# Patient Record
Sex: Male | Born: 1985 | Race: Black or African American | Hispanic: No | Marital: Married | Smoking: Current every day smoker
Health system: Southern US, Community
[De-identification: ages and names within clinical notes are randomized; demographics above are authoritative.]

## PROBLEM LIST (undated history)

## (undated) DIAGNOSIS — J45909 Unspecified asthma, uncomplicated: Secondary | ICD-10-CM

---

## 2004-08-02 ENCOUNTER — Ambulatory Visit: Payer: Self-pay | Admitting: Internal Medicine

## 2004-12-08 ENCOUNTER — Ambulatory Visit: Payer: Self-pay | Admitting: Internal Medicine

## 2004-12-16 ENCOUNTER — Ambulatory Visit: Payer: Self-pay | Admitting: Internal Medicine

## 2005-11-10 ENCOUNTER — Ambulatory Visit: Payer: Self-pay | Admitting: Internal Medicine

## 2006-06-06 ENCOUNTER — Ambulatory Visit: Payer: Self-pay | Admitting: Internal Medicine

## 2006-11-15 ENCOUNTER — Ambulatory Visit: Payer: Self-pay | Admitting: Internal Medicine

## 2007-04-10 DIAGNOSIS — J45909 Unspecified asthma, uncomplicated: Secondary | ICD-10-CM | POA: Insufficient documentation

## 2007-04-10 DIAGNOSIS — J309 Allergic rhinitis, unspecified: Secondary | ICD-10-CM | POA: Insufficient documentation

## 2008-11-30 ENCOUNTER — Emergency Department (HOSPITAL_COMMUNITY): Admission: EM | Admit: 2008-11-30 | Discharge: 2008-11-30 | Payer: Self-pay | Admitting: Emergency Medicine

## 2009-07-02 ENCOUNTER — Ambulatory Visit: Payer: Self-pay | Admitting: Internal Medicine

## 2009-10-04 ENCOUNTER — Ambulatory Visit: Payer: Self-pay | Admitting: Family Medicine

## 2010-05-18 ENCOUNTER — Ambulatory Visit: Payer: Self-pay | Admitting: Internal Medicine

## 2010-08-01 ENCOUNTER — Telehealth: Payer: Self-pay | Admitting: Internal Medicine

## 2010-09-27 NOTE — Assessment & Plan Note (Signed)
Summary: FLU SHOT // RS  Nurse Visit   Vital Signs:  Patient profile:   25 year old male Temp:     98.4 degrees F  Vitals Entered By: Duard Brady LPN (May 18, 2010 2:33 PM)  Allergies: No Known Drug Allergies  Orders Added: 1)  Admin 1st Vaccine [90471] 2)  Flu Vaccine 59yrs + [16109] Flu Vaccine Consent Questions     Do you have a history of severe allergic reactions to this vaccine? no    Any prior history of allergic reactions to egg and/or gelatin? no    Do you have a sensitivity to the preservative Thimersol? no    Do you have a past history of Guillan-Barre Syndrome? no    Do you currently have an acute febrile illness? no    Have you ever had a severe reaction to latex? no    Vaccine information given and explained to patient? yes    Are you currently pregnant? no    Lot Number:[id number]   Exp Date:02/25/2011   Site Given  Left Deltoid IM .lbflu

## 2010-09-27 NOTE — Progress Notes (Signed)
Summary: Provintil refill to [facility]  Phone Note Call from Patient Call back at [phone]   Caller: Patient Call For: Gordy Savers  MD Summary of Call: VM from pt requesting the refill Proventil go to [facility] Mail order Initial call taken by: Sid Falcon LPN,  August 01, 2010 5:08 PM  Follow-up for Phone Call        attempt to call - VM - LMTCB if questions - rx faxed to [facility] - check with them tomorrow to see if they recv'd refill auth. KIk Follow-up by: Duard Brady LPN,  August 01, 2010 5:31 PM    Prescriptions: PROVENTIL HFA 108 (90 BASE) MCG/ACT AERS (ALBUTEROL SULFATE) two puffs every 6 hours as needed for shortness of breath  #6.7 Not Spec x 2   Entered by:   Duard Brady LPN   Authorized by:   Gordy Savers  MD   Signed by:   Duard Brady LPN on 45/40/9811   Method used:   Faxed to ...       [facility]       [address] [place], [place]  [address]       Ph: [phone]       Fax: [phone]   RxID:   [id number]  refaxed to [facility]

## 2010-09-27 NOTE — Assessment & Plan Note (Signed)
Summary: N/V, FEVER // RS   Vital Signs:  Patient profile:   25 year old male Temp:     99.7 degrees F oral BP sitting:   120 / 88  (left arm) Cuff size:   regular  Vitals Entered By: Sid Falcon LPN (October 04, 2009 3:42 PM) CC: nausea & vomiting, fever X 3 days   History of Present Illness: Acute illness. Patient has history of asthma. Acute onset Friday last week chills, body aches, fever up to 103 Saturday and  nonproductive cough. Had vomiting twice Saturday but none since. No diarrhea. Denies sore throat. Ibuprofen helps aches and headache temporarily.  Hx asthma and using Albuterol as needed.  Allergies: No Known Drug Allergies  Past History:  Past Medical History: Last updated: 04/10/2007 Allergic rhinitis Asthma  Social History: Last updated: 04/10/2007 Current Smoker Drug use-no  Review of Systems      See HPI  Physical Exam  General:  patient is alert nontoxic in appearance Eyes:  No corneal or conjunctival inflammation noted. EOMI. Perrla. Funduscopic exam benign, without hemorrhages, exudates or papilledema. Vision grossly normal. Ears:  External ear exam shows no significant lesions or deformities.  Otoscopic examination reveals clear canals, tympanic membranes are intact bilaterally without bulging, retraction, inflammation or discharge. Hearing is grossly normal bilaterally. Mouth:  Oral mucosa and oropharynx without lesions or exudates.  Teeth in good repair. Neck:  No deformities, masses, or tenderness noted.supple Lungs:  few rhonchi right base possibly greater than left. A few faint wheezes but no retractions. Symmetric breath sounds otherwise Heart:  Normal rate and regular rhythm. S1 and S2 normal without gallop, murmur, click, rub or other extra sounds. Skin:  no rash Cervical Nodes:  No lymphadenopathy noted   Impression & Recommendations:  Problem # 1:  COUGH (ICD-786.2) Prob started as viral process.  With hx asthma and ?of asymmetric  breath sounds will start antibiotics.  CXR if not steadily improving next few days.  Complete Medication List: 1)  Allegra 60 Mg Tabs (Fexofenadine hcl) .... Take 1 tablet by mouth once a day 2)  Triderm 0.1 % Crea (Triamcinolone acetonide) .... Apply 1 a small amount to skin twice a day 3)  Proventil Hfa 108 (90 Base) Mcg/act Aers (Albuterol sulfate) .... Two puffs every 6 hours as needed for shortness of breath 4)  Prednisone 10 Mg Tabs (Prednisone) .... Two twice daily for 3 days, then one twice daily for 3 days, then one daily 5)  Azithromycin 250 Mg Tabs (Azithromycin) .... 2 by mouth today then one by mouth once daily for 4 days Prescriptions: AZITHROMYCIN 250 MG TABS (AZITHROMYCIN) 2 by mouth today then one by mouth once daily for 4 days  #6 x 0   Entered and Authorized by:   Evelena Peat MD   Signed by:   Evelena Peat MD on 10/04/2009   Method used:   [id number]       [address]       [place], [place]  [address]       Ph: [phone] or [phone]       Fax: [phone]   RxID:   [id number]

## 2011-03-20 ENCOUNTER — Ambulatory Visit (INDEPENDENT_AMBULATORY_CARE_PROVIDER_SITE_OTHER): Payer: Self-pay | Admitting: Internal Medicine

## 2011-03-20 ENCOUNTER — Encounter: Payer: Self-pay | Admitting: Internal Medicine

## 2011-03-20 VITALS — BP 100/60 | Temp 98.8°F | Wt 162.0 lb

## 2011-03-20 DIAGNOSIS — J069 Acute upper respiratory infection, unspecified: Secondary | ICD-10-CM

## 2011-03-20 DIAGNOSIS — D573 Sickle-cell trait: Secondary | ICD-10-CM

## 2011-03-20 DIAGNOSIS — J029 Acute pharyngitis, unspecified: Secondary | ICD-10-CM

## 2011-03-20 MED ORDER — METHYLPREDNISOLONE ACETATE 80 MG/ML IJ SUSP
80.0000 mg | Freq: Once | INTRAMUSCULAR | Status: AC
  Administered 2011-03-20: 80 mg via INTRAMUSCULAR

## 2011-03-20 MED ORDER — HYDROCODONE-ACETAMINOPHEN 5-500 MG PO TABS: 2.0000 | ORAL_TABLET | Freq: Four times a day (QID) | ORAL | Status: AC | PRN

## 2011-03-20 NOTE — Patient Instructions (Signed)
Vimovo 1 twice daily  Pain medication as directed  Call or return to clinic prn if these symptoms worsen or fail to improve as anticipated.

## 2011-03-20 NOTE — Progress Notes (Signed)
  Subjective:    Patient ID: [name], male    DOB: 1985-12-16, 25 y.o.   MRN: [id number]  HPI  25 year old patient who is seen today in followup. He has a history of asthma and sickle cell trait. He has been ill for the past 10 days and 7 days ago was evaluated at a [facility] ER. He presented with laryngitis sore throat and arthralgias. He was here with IV fluids steroid therapy and has completed 1 week of amoxicillin. He is also given oxycodone for pain.. Initially had improvement of his symptoms but over the weekend has developed more sore throat and fever. His chief complaint is sore throat. He was tested for influenza A and group A hemolytic strep in the ED which was negative. He feels unwell with poor appetite there has been no chills or any sputum production. He denies any wheezing    Review of Systems  Constitutional: Positive for appetite change. Negative for fever, chills and fatigue.  HENT: Negative for hearing loss, ear pain, congestion, sore throat, trouble swallowing, neck stiffness, dental problem, voice change and tinnitus.   Eyes: Negative for pain, discharge and visual disturbance.  Respiratory: Negative for cough, chest tightness, wheezing and stridor.   Cardiovascular: Negative for chest pain, palpitations and leg swelling.  Gastrointestinal: Negative for nausea, vomiting, abdominal pain, diarrhea, constipation, blood in stool and abdominal distention.  Genitourinary: Negative for urgency, hematuria, flank pain, discharge, difficulty urinating and genital sores.  Musculoskeletal: Positive for arthralgias. Negative for myalgias, back pain, joint swelling and gait problem.  Skin: Negative for rash.  Neurological: Negative for dizziness, syncope, speech difficulty, weakness, numbness and headaches.  Hematological: Negative for adenopathy. Does not bruise/bleed easily.  Psychiatric/Behavioral: Negative for behavioral problems and dysphoric mood. The patient is not  nervous/anxious.        Objective:   Physical Exam  Constitutional: He is oriented to person, place, and time. He appears well-developed.  HENT:  Head: Normocephalic.  Right Ear: External ear normal.  Left Ear: External ear normal.  Mouth/Throat: Oropharynx is clear and moist.       Oropharynx normal  Eyes: Conjunctivae and EOM are normal.  Neck: Normal range of motion.  Cardiovascular: Normal rate and normal heart sounds.   Pulmonary/Chest: Effort normal and breath sounds normal. No respiratory distress. He has no wheezes. He has no rales.  Abdominal: Bowel sounds are normal.  Musculoskeletal: Normal range of motion. He exhibits no edema and no tenderness.  Lymphadenopathy:    He has no cervical adenopathy.  Neurological: He is alert and oriented to person, place, and time.  Psychiatric: He has a normal mood and affect. His behavior is normal.          Assessment & Plan:   Viral URI with pharyngitis Arthralgias possible secondary to sickle cell trait  We'll treat symptomatically. Samples of Vimovo dispensed. A prescription for Vicodin also dispensed

## 2011-03-20 NOTE — Progress Notes (Signed)
Addended by: Duard Brady I on: 03/20/2011 10:31 AM   Modules accepted: Orders

## 2011-03-29 ENCOUNTER — Ambulatory Visit: Payer: Self-pay | Admitting: Internal Medicine

## 2011-03-31 ENCOUNTER — Inpatient Hospital Stay (HOSPITAL_COMMUNITY)
Admission: AD | Admit: 2011-03-31 | Discharge: 2011-04-05 | DRG: 096 | Disposition: A | Discharge status: Home / Self Care | Payer: Self-pay | Source: Ambulatory Visit | Attending: Neurology | Admitting: Neurology

## 2011-03-31 ENCOUNTER — Inpatient Hospital Stay (HOSPITAL_COMMUNITY): Payer: Self-pay

## 2011-03-31 ENCOUNTER — Ambulatory Visit (INDEPENDENT_AMBULATORY_CARE_PROVIDER_SITE_OTHER): Payer: Self-pay | Admitting: Family Medicine

## 2011-03-31 ENCOUNTER — Encounter: Payer: Self-pay | Admitting: Family Medicine

## 2011-03-31 VITALS — BP 100/80 | Temp 98.3°F | Wt 151.0 lb

## 2011-03-31 DIAGNOSIS — R29898 Other symptoms and signs involving the musculoskeletal system: Secondary | ICD-10-CM

## 2011-03-31 DIAGNOSIS — M6281 Muscle weakness (generalized): Secondary | ICD-10-CM

## 2011-03-31 DIAGNOSIS — K5900 Constipation, unspecified: Secondary | ICD-10-CM | POA: Diagnosis not present

## 2011-03-31 DIAGNOSIS — G610 Guillain-Barre syndrome: Principal | ICD-10-CM | POA: Diagnosis present

## 2011-03-31 DIAGNOSIS — F1210 Cannabis abuse, uncomplicated: Secondary | ICD-10-CM | POA: Diagnosis present

## 2011-03-31 DIAGNOSIS — R000 Tachycardia, unspecified: Secondary | ICD-10-CM | POA: Diagnosis not present

## 2011-03-31 DIAGNOSIS — R202 Paresthesia of skin: Secondary | ICD-10-CM

## 2011-03-31 DIAGNOSIS — R209 Unspecified disturbances of skin sensation: Secondary | ICD-10-CM

## 2011-03-31 DIAGNOSIS — R1310 Dysphagia, unspecified: Secondary | ICD-10-CM | POA: Diagnosis present

## 2011-03-31 DIAGNOSIS — R292 Abnormal reflex: Secondary | ICD-10-CM

## 2011-03-31 DIAGNOSIS — F17200 Nicotine dependence, unspecified, uncomplicated: Secondary | ICD-10-CM | POA: Diagnosis present

## 2011-03-31 LAB — DIFFERENTIAL
Basophils Absolute: 0.1 10*3/uL (ref 0.0–0.1)
Basophils Relative: 2 % — ABNORMAL HIGH (ref 0–1)
Eosinophils Relative: 0 % (ref 0–5)
Monocytes Absolute: 0.5 10*3/uL (ref 0.1–1.0)
Neutro Abs: 1.4 10*3/uL — ABNORMAL LOW (ref 1.7–7.7)

## 2011-03-31 LAB — CBC
HCT: 40.2 % (ref 39.0–52.0)
MCV: 85 fL (ref 78.0–100.0)
RBC: 4.73 MIL/uL (ref 4.22–5.81)
WBC: 5.5 10*3/uL (ref 4.0–10.5)

## 2011-03-31 LAB — COMPREHENSIVE METABOLIC PANEL
ALT: 158 U/L — ABNORMAL HIGH (ref 0–53)
AST: 84 U/L — ABNORMAL HIGH (ref 0–37)
CO2: 26 mEq/L (ref 19–32)
Chloride: 98 mEq/L (ref 96–112)
GFR calc non Af Amer: >60 mL/min (ref >60)
Potassium: 3.9 mEq/L (ref 3.5–5.1)
Sodium: 132 mEq/L — ABNORMAL LOW (ref 135–145)
Total Bilirubin: 0.5 mg/dL (ref 0.3–1.2)

## 2011-03-31 LAB — APTT: aPTT: 34 seconds (ref 24–37)

## 2011-03-31 MED ORDER — GADOBENATE DIMEGLUMINE 529 MG/ML IV SOLN
15.0000 mL | Freq: Once | INTRAVENOUS | Status: AC | PRN
  Administered 2011-03-31: 15 mL via INTRAVENOUS

## 2011-03-31 NOTE — Progress Notes (Signed)
Subjective:    Patient ID: [name], male    DOB: November 09, 1985, 25 y.o.   MRN: [id number]  HPI Patient seen with progressive dysesthesias and weakness predominantly lower extremities over the past 5-6 days. Recent history is that he developed upper respiratory symptoms about 2 weeks ago with laryngitis and sore throat. Went to emergency room in [place] and was treated with IV steroids and amoxicillin and IV fluids. Felt somewhat better and was seen here several days later and given another shot of Depo-Medrol and again felt somewhat better for about 24 hours. Last Friday he noticed tingling sensation involving much of his body and by Sunday -5 days ago noted onset of weakness which has progressed over the week. He cannot stand without support at this time and is unable to ambulate.  Denies low back pain. No urine or stool incontinence  He has some dyspnea with activity such as transfers. Poor appetite. 11 pound weight loss since 10 days ago. Patient quit smoking a few weeks ago and pt thought symptoms related to nicotine withdrawal. He denies any fever, chills, diarrhea, vomiting, abdominal pain. Occasional diffuse mild headache.    Only chronic problem is mild intermittent asthma. Takes albuterol for that.  Patient relates L knee injured yesterday when police came to his house. He states that he called some friends to assist him at his apartment because he was unable to walk.  His friends had to jump a fence to get into his place and someone reported a possible breaking and entering.  Pt states his friends were held at gunpoint and he (pt) drug himself into their presence to notify police they were his friends.  At that point, patient states he was handcuffed and stood up and subsequently fell on his L knee (as he was unable to stand).  Abrasion to L knee but no other injury reported.   Review of Systems  Constitutional: Positive for activity change, appetite change, fatigue and unexpected  weight change. Negative for fever and chills.  HENT: Negative for ear pain, sore throat, trouble swallowing, neck pain and neck stiffness.   Respiratory: Positive for shortness of breath. Negative for cough and wheezing.   Cardiovascular: Negative for chest pain, palpitations and leg swelling.  Gastrointestinal: Negative for vomiting, abdominal pain, diarrhea and blood in stool.  Genitourinary: Negative for dysuria and hematuria.  Musculoskeletal: Negative for myalgias, back pain and joint swelling.  Skin: Negative for rash.  Neurological: Positive for weakness, numbness and headaches. Negative for dizziness, tremors, seizures and syncope.  Hematological: Negative for adenopathy. Does not bruise/bleed easily.       Objective:   Physical Exam  Constitutional: He is oriented to person, place, and time. He appears well-developed and well-nourished.  HENT:  Head: Normocephalic and atraumatic.  Right Ear: External ear normal.  Left Ear: External ear normal.  Mouth/Throat: Oropharynx is clear and moist.  Eyes: Pupils are equal, round, and reactive to light.  Neck: Neck supple. No thyromegaly present.  Cardiovascular: Exam reveals no gallop.   No murmur heard.      Regular rhythm but slightly tachycardic with rate around 100  Pulmonary/Chest: Effort normal and breath sounds normal. No respiratory distress. He has no wheezes. He has no rales.  Abdominal: Soft. Bowel sounds are normal. He exhibits no distension and no mass. There is no tenderness. There is no rebound and no guarding.  Musculoskeletal: He exhibits no edema.       L knee no effusion.  Small superficial  abrasion.  No ecchymosis and nontender with full ROM.  Lymphadenopathy:    He has no cervical adenopathy.  Neurological: He is alert and oriented to person, place, and time.       Patient is basically areflexic at the knee and trace reflex ankle. 1+ upper extremity reflexes. He has significant weakness involving the lower  extremities with plantar flexion, dorsiflexion, knee extension, and hip flexion bilaterally  Skin: No rash noted.  Psychiatric: He has a normal mood and affect. His behavior is normal.          Assessment & Plan:  Progressive lower extremity weakness and dysesthesias following recent respiratory illness. Symptoms have progressed over the week. Concern for possible Guillain-Barr syndrome. Patient unable to care for himself at this time. Hospital admission to further evaluate Small abrasion L knee related to injury above.

## 2011-04-01 ENCOUNTER — Inpatient Hospital Stay (HOSPITAL_COMMUNITY): Payer: Self-pay

## 2011-04-01 LAB — BASIC METABOLIC PANEL
BUN: 13 mg/dL (ref 6–23)
CO2: 27 mEq/L (ref 19–32)
Calcium: 9.5 mg/dL (ref 8.4–10.5)
Creatinine, Ser: 0.68 mg/dL (ref 0.50–1.35)
GFR calc non Af Amer: >60 mL/min (ref >60)
Glucose, Bld: 110 mg/dL — ABNORMAL HIGH (ref 70–99)

## 2011-04-01 LAB — CSF CELL COUNT WITH DIFFERENTIAL
RBC Count, CSF: 21 /mm3 — ABNORMAL HIGH
RBC Count, CSF: 49 /mm3 — ABNORMAL HIGH
Tube #: 1
Tube #: 4

## 2011-04-01 LAB — URINALYSIS, ROUTINE W REFLEX MICROSCOPIC
Glucose, UA: NEGATIVE mg/dL
Hgb urine dipstick: NEGATIVE
Leukocytes, UA: NEGATIVE
Specific Gravity, Urine: 1.021 (ref 1.005–1.030)
Urobilinogen, UA: 1 mg/dL (ref 0.0–1.0)

## 2011-04-01 LAB — RAPID URINE DRUG SCREEN, HOSP PERFORMED
Amphetamines: NOT DETECTED
Barbiturates: NOT DETECTED
Tetrahydrocannabinol: POSITIVE — AB

## 2011-04-01 LAB — PROTEIN AND GLUCOSE, CSF
Glucose, CSF: 66 mg/dL (ref 43–76)
Total  Protein, CSF: 49 mg/dL — ABNORMAL HIGH (ref 15–45)

## 2011-04-01 LAB — CBC
HCT: 38.5 % — ABNORMAL LOW (ref 39.0–52.0)
Hemoglobin: 14 g/dL (ref 13.0–17.0)
MCH: 31.2 pg (ref 26.0–34.0)
MCHC: 36.4 g/dL — ABNORMAL HIGH (ref 30.0–36.0)
MCV: 85.7 fL (ref 78.0–100.0)
RBC: 4.49 MIL/uL (ref 4.22–5.81)

## 2011-04-01 LAB — HIV ANTIBODY (ROUTINE TESTING W REFLEX): HIV: NONREACTIVE

## 2011-04-02 LAB — BASIC METABOLIC PANEL
BUN: 13 mg/dL (ref 6–23)
CO2: 27 mEq/L (ref 19–32)
Chloride: 99 mEq/L (ref 96–112)
GFR calc Af Amer: >60 mL/min (ref >60)
Glucose, Bld: 91 mg/dL (ref 70–99)
Potassium: 4.2 mEq/L (ref 3.5–5.1)

## 2011-04-02 LAB — CBC
HCT: 40.8 % (ref 39.0–52.0)
MCV: 86.4 fL (ref 78.0–100.0)
RBC: 4.72 MIL/uL (ref 4.22–5.81)
RDW: 12.3 % (ref 11.5–15.5)
WBC: 5.5 10*3/uL (ref 4.0–10.5)

## 2011-04-03 ENCOUNTER — Inpatient Hospital Stay (HOSPITAL_COMMUNITY): Payer: Self-pay

## 2011-04-03 DIAGNOSIS — G610 Guillain-Barre syndrome: Secondary | ICD-10-CM

## 2011-04-04 LAB — BASIC METABOLIC PANEL
BUN: 15 mg/dL (ref 6–23)
CO2: 28 mEq/L (ref 19–32)
Chloride: 98 mEq/L (ref 96–112)
Creatinine, Ser: 0.66 mg/dL (ref 0.50–1.35)
GFR calc Af Amer: >60 mL/min (ref >60)
Glucose, Bld: 97 mg/dL (ref 70–99)

## 2011-04-04 LAB — CSF CULTURE W GRAM STAIN

## 2011-04-04 NOTE — H&P (Signed)
NAMEMYAN, SUIT NO.:  [id number]  MEDICAL RECORD NO.:  [id number]  LOCATION:  3312                         FACILITY:  [facility]  PHYSICIAN:  Tarry Kos, MD       DATE OF BIRTH:  [birth year]  DATE OF ADMISSION:  03/31/2011 DATE OF DISCHARGE:                             HISTORY & PHYSICAL   CHIEF COMPLAINT:  Lower extremity weakness.  HISTORY OF PRESENT ILLNESS:  [name] is a 25 year old healthy male who presented to his PCP's office today, Dr. Caryl Never because of progressive worsening lower extremity and upper extremity weakness.  [name] says that a week ago today on Friday, he started experiencing some perioral numbness.  By Saturday, the following day, he started experiencing some numbness in his fingers and toes which progressed over last weekend and went to [facility] on Monday where he had low strength in his bilateral lower legs.  He said he went to the [facility] ED, they told him they could not find anything wrong, sent him home, and if things got worse for him to follow up with his primary care physician.  Since that time, he has had progressive worsening lower extremity weakness in his lower extremities, also starting to get weak in his bilateral upper extremities.  He has significant numbness in his bilateral lower extremities and also in his gentle genital, scrotum.  He is not having any problems with urinating.  When he went to his primary care physician's office today, he could not walk.  He had to be sent here via ambulance, because he could not ambulate.  He denies running any fevers, however, approximately 3 weeks ago, he was diagnosed with laryngitis and was treated with steroids and amoxicillin.  At that time, he was running a fever.  However, since that illness, he has not been running any fevers.  He denies any other facial weakness or any neurological deficits in his head and neck.  He denies any back pain.  PAST MEDICAL  HISTORY:  As above only.  ALLERGIES:  Denies.  MEDICATIONS:  Albuterol inhaler.  REVIEW OF SYSTEMS:  Otherwise negative.  He has not had any recent traveling out of the country.  SOCIAL HISTORY:  Does not smoke, no alcohol, no IV drug abuse.  PHYSICAL EXAMINATION:  VITAL SIGNS:  He is afebrile with blood pressure 100/80, pulse 100, respirations normal, O2 sats normal on room air. GENERAL:  He is alert and oriented x4.  No apparent distress, cooperative and friendly. HEENT:  Extraocular motions intact.  Pupils equal and reactive to light. Oropharynx clear.  Mucous membranes moist. NECK:  No JVD.  No carotid bruits. CARDIAC:  Regular rate and rhythm without murmurs or gallops. CHEST:  Clear to auscultation bilaterally.  No wheezes, rhonchi, or rales. ABDOMEN:  Soft, nontender, nondistended.  Positive bowel sounds.  No hepatosplenomegaly. EXTREMITIES:  No clubbing, cyanosis, or edema. PSYCHIATRIC:  Normal affect. NEUROLOGIC:  Cranial nerves II through XII grossly intact.  I cannot obtain any reflexes of bilateral knees.  He has got 2/5 bilateral lower extremity strength.  Upper extremities also decrease, weakness, 4/5 with decreased reflexes also 1+ and subjective numbness.  LABORATORY DATA:  None.  ASSESSMENT AND PLAN:  This is a 25 year old male with progressive worsening peripheral upper and lower extremity weakness and numbness concerning for Guillain-Barre following an upper respiratory tract infection.  Peripheral extremity numbness and loss of strength.  I have called Neurology, who will be seeing the patient.  I am going to send off for a complete metabolic panel, mag level, phos, CBC with diff, coags, thyroid studies, sed rate, RPR, HIV, and B12.  Also I am going to obtain a stat MRI of the brain and C-spine with contrast tonight.  Further neurological workup per Neurology on-call.  Probably we will need an LP. I have ordered for neurological checks every 2 hours.  I  have placed him in step-down unit for close hemodynamic monitoring.  He is currently not having any respiratory compromise; however, we need to monitor very closely due to the progression of this over the last week.  Again, Neurology has already been notified and will be seeing the patient tonight.  The patient is a full code.  Further workup recommendations per Neurology.          ______________________________ Tarry Kos, MD     RD/MEDQ  D:  03/31/2011  T:  04/01/2011  Job:  [id number]  Electronically Signed by Tarry Kos MD on 04/04/2011 09:57:52 PM

## 2011-04-10 NOTE — Discharge Summary (Signed)
[name], [name] NO.:  [id number]  MEDICAL RECORD NO.:  [id number]  LOCATION:  3312                         FACILITY:  [facility]  PHYSICIAN:  Thana Farr, MD    DATE OF BIRTH:  08-31-1985  DATE OF ADMISSION:  03/31/2011 DATE OF DISCHARGE:  04/05/2011                              DISCHARGE SUMMARY   Admitting date was March 31, 2011, with initial diagnosis of lower extremity weakness.  Discharge date is approximated to be on April 05, 2011, with a discharge diagnosis of Guillain-Barre.  BRIEF HISTORY:  This is a 25 year old  with history of asthma for 2 weeks.  He developed laryngitis, sore throat, malaise symptoms, and was evaluated at [facility] twice and treated with supportive care. Approximately 1 weeks ago, he began to develop numbness in his lips, fingers tips and toes.  This had progressed to become weakness in his arms and legs.  He then showed mild difficulty with chewing and swallowing.  For that reason, the patient was brought to [facility]  At [facility], initially was seen by [facility] who admitted the patient and asked Neurology to follow.  The patient was seen by Dr. Marjory Lies from Neurology who did a full assessment including lumbar puncture, which showed a protein of 49, glucose of 66, 2 white blood cell count, red blood cells 49, and CSF colorless.  At that time, Dr. Marjory Lies believed that given the 2-week history of sore throat, laryngitis symptoms, and progressive numbness, there was high suspicion for Guillain-Barre.  The patient was started on IVIG the following morning, received another dose the following night, and then has received a total of 5 doses by the time of discharge.  HOSPITAL COURSE:  On initial evaluation, the patient showed mild decreased right facial strength, facial sensation to be symmetrical. Neck flexion weakness of 4/5, 4/5 strength in his bilateral deltoids, 3/5 in the  biceps, 3/5 in the triceps, and his grip was 4/5.  He could barely lift his leg antigravity, bilateral hip flexors, knee flexion/extension was 4/5, dorsal, plantar flexion and extension was 5/5.  Sensory exam was intact, and he showed trace reflexes in bilateral brachioradialis and absent in biceps, triceps, knees and ankles.  After receiving four doses of IVIG, the patient's exam shows weakness, mostly focused around his upper lip region along with some decreased sensation in the superior lip.  He is having difficulty wrapping his lips around the straw to drink fluids.  His speech is fluent.  He shows 4+/5 strength throughout at this time.  He continues to have some decreased sensation from his arms down to his legs, but states that this is improved.  He has 2+ brachioradialis reflexes, trace at the biceps.  I do not note any triceps reflexes, nor do I note any bilateral patella or ankle reflexes.  He does have downgoing toes bilaterally.  The patient has been up and walked with physical therapy with a walker with one- person assist.  OTHER COMPLICATIONS DURING HOSPITAL STAY: 1. Headache, which has now resolved. 2. The patient has shown asymptomatic tachycardia which continues, but     remains asymptomatic. 3. He has shown some  dysphagia which has been worked up by [facility]     [facility]rapy under a modified barium swallow and they recommended him to     be on a dysphagia III diet with frequent clearing of throat. 4. Constipation.  The patient has not had bowel movements since he has     been hospitalized, and for that reason at this time he will be     started on a bowel regime of MiraLax and Dulcolax.  MEDICATIONS:  The patient will be discharged on the same medications he is on the present time.  These include: 1. Tylenol 650 mg p.o. q.24 h. 2. Vitamin C 500 mg p.o. daily. 3. Benadryl 25 mg q.24 h. 4. Ensure 237 mL p.o. t.i.d. 5. Multivitamin 1 tablet p.o. daily. 6. Zofran 4 mg IV  nightly. 7. Protonix 40 mg p.o. as needed. 8. Albuterol 2 puffs inhaler q.4 h. P.r.n. 9. NicoDerm patch 14 mg daily p.r.n. 10.Ultram 50 mg p.o. t.i.d. p.r.n.  LABS DURING HOSPITAL STAY:  The patient's sodium is 132, potassium 3.8, chloride 98, CO2 28, glucose 97, BUN 15, creatinine 0.66.  White blood cell count 5.5, hemoglobin 14.5, hematocrit 40.8, platelets 241.  LP findings showed a glucose of 66, protein of 49, 2 white blood cells, 49 red blood cells, few lymphocyte, supernatant was clear.  No growth on CSF culture.  Urine drug screen is positive for THC.  Urinalysis was negative.  Phosphorus 3.7.  HIV was negative.  ESR was 42.  B12 was 822. TSH was 0.971.  RPR was negative.  PHYSICAL EXAM AT TIME OF DISCHARGE:  As noted above.  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  The patient's problem list includes the following: 1. GBS, status post IVIG treatment with improved muscle strength with     plan to be discharged home.  Physical therapy has seen patient and no longer recommends CIR. 2. Nausea, vomiting, which is now resolved.  Headaches, now resolved. 3. Asymptomatic tachycardia which will be continued to be watched as     it remains asymptomatic. 4. Dysphagia.  The patient will remain on dysphagia III diet with thin     liquids upon discharge to inpatient rehab.  Speech Therapy will     continue to follow in rehab. 5. Constipation.  The patient has not had a bowel movement.  I will     continue with a limited dose of MiraLax and Dulcolax to continue     healthy bowel movements.   6. At this time, after the patient has received his last dose of IVIG, the patient will bedischarged home on       all home medications and follow up with [facility] in 2-3 weeks.  Patient will be D/C'd with walker and has good      family support at home.      Felicie Morn, PA-C   ______________________________ Thana Farr,  MD    DS/MEDQ  D:  04/04/2011  T:  04/04/2011  Job:  [id number]  Electronically Signed by Felicie Morn PA-C on 04/05/2011 10:39:14 AM Electronically Signed by Thana Farr MD on 04/10/2011 12:51:19 AM

## 2011-05-03 NOTE — Consult Note (Signed)
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  HISTORY OF PRESENT ILLNESS:  A 25 year old male with history of asthma for 2 weeks.  He had developed laryngitis, sore throat, malaise symptoms, was evaluated at [facility] twice and treated with supportive care.  Approximately 1 week ago, he began to develop numbness in his lips, fingertips, and toes.  This has progressed.  He has also developed progressive weakness of his arms and legs.  He has mild difficulty with chewing and swallowing.  Today he is unable to walk.  He was admitted directly to the hospital for further evaluation.  PAST MEDICAL HISTORY:  Asthma.  PAST SURGICAL HISTORY:  None.  SOCIAL HISTORY:  He smokes 1-/2 packs of cigarettes per day.  He has been trying to quit since this illness has started.  He has had poor appetite and lost 11 pounds.  No alcohol or illicit drug use.  REVIEW OF SYSTEMS:  As per the HPI noted for decreased appetite, fatigue, weight loss, sore throat, trouble swallowing, difficulty breathing, numbness, and weakness.  Otherwise full 14 system review is negative.  MEDICATIONS:  Albuterol inhaler as needed.  ALLERGIES:  None.  PHYSICAL EXAMINATION:  VITAL SIGNS:  Blood pressure 137/87, heart rate 93, respirations 14, O2 sat 98% on room air. NEUROLOGIC:  He is awake and alert.  Language is fluent.  Comprehension intact.  Cranial nerve examination:  Pupils are reactive 3 to 2 mm. Visual fields full to confrontation.  Extraocular muscles intact.  He has mild decreased right lower facial strength.  Facial sensation symmetric.  Uvula is midline.  Shoulder is symmetric.   Tongue is midline.  Motor examination:  Normal bulk and tone.  He has mild neck flexion weakness, 4/5 bilateral deltoids, 3/5 biceps, triceps, and grip strength 4/5.  Hip flexors, he is barely able to lift his legs antigravity and drop down to the bed.  Knee flexion extension 4/5. Dorsiflexion and plantar flexion 5/5.  Sensory examination:  Intact to light touch, pinprick, temperature, vibration, proprioception.  Reflexes are trace in the bilateral brachioradialis, absent at the biceps, triceps, knees, and ankles.  Downgoing toes.  Coordination testing:  No ataxia. CARDIOVASCULAR:  Regular rate and rhythm, at times tachycardic. No murmurs, no carotid bruits.  LAB TESTING:  MRI of the brain and cervical spine with and without contrast is normal.  White count 5.5, platelets 223,000.  PT 14.0, INR 1.06.  Sodium 132, potassium 3.9, BUN 14, creatinine 0.69.  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  PLAN: 1. Start IVIG treatment tonight. 2. LP bedside. 3. Check FVC and negative inspiratory force every 4 hours. 4. We will check muscle enzymes testing, further serum lab testing  pending.     Joycelyn Schmid, MD     VP/MEDQ  D:  04/01/2011  T:  04/01/2011  Job:  [id number]  Electronically Signed by Joycelyn Schmid  on 05/03/2011 06:54:47 PM

## 2012-05-26 IMAGING — CR DG CHEST 2V
1 series · 1 of 1 positions shown · non-contrast
Comparison: None.

CLINICAL DATA: Weakness, muscle weakness

CHEST - 2 VIEW

[w chest lat]
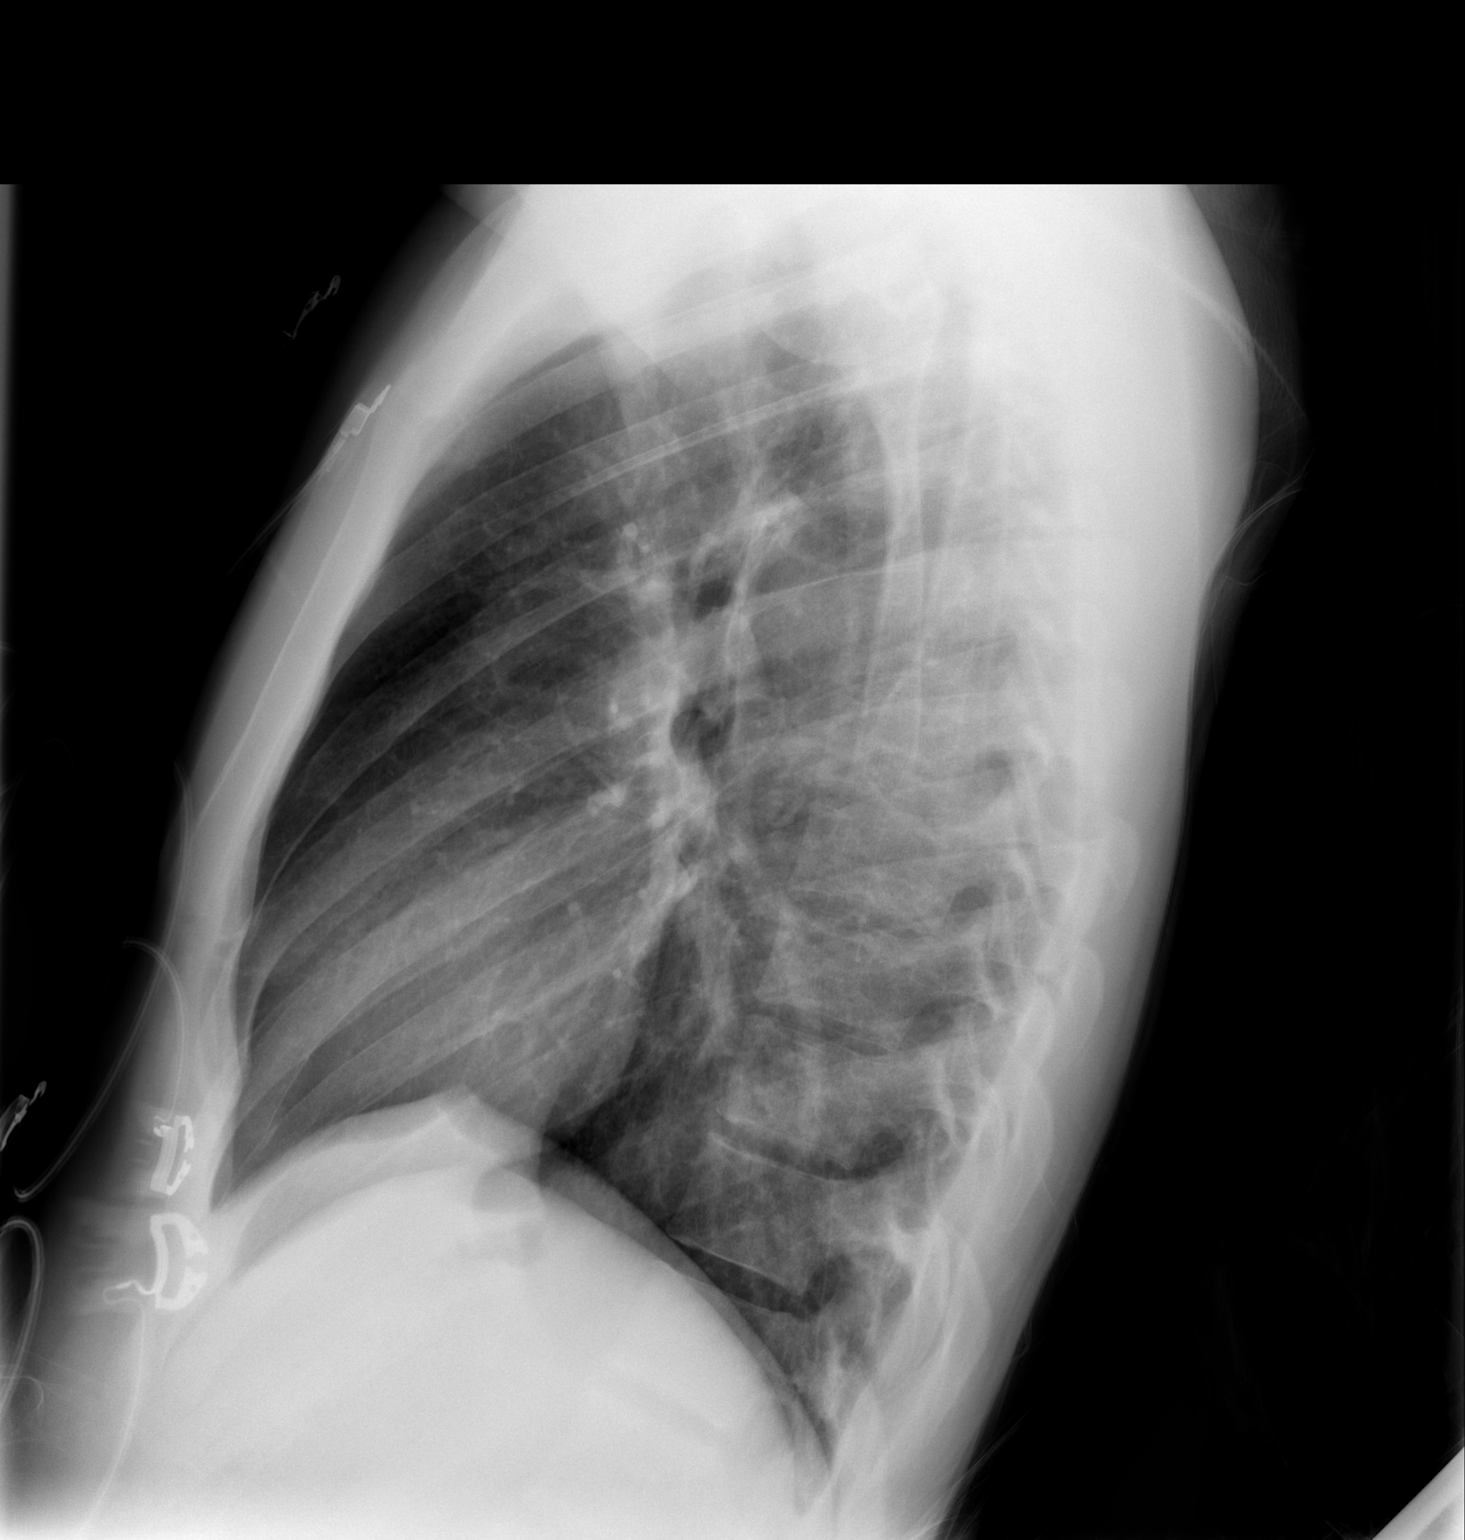

[1 of 1 positions shown; findings below may reference images not displayed]

FINDINGS: Heart size and vascular pattern are normal.  Lungs are
clear.  No pleural effusion or pneumothorax.  Mediastinal and hilar
contours are normal.
IMPRESSION: Normal chest x-ray.

## 2014-03-18 ENCOUNTER — Ambulatory Visit: Payer: Self-pay | Admitting: Internal Medicine

## 2014-03-20 ENCOUNTER — Ambulatory Visit (INDEPENDENT_AMBULATORY_CARE_PROVIDER_SITE_OTHER): Payer: Self-pay | Admitting: Internal Medicine

## 2014-03-20 ENCOUNTER — Encounter: Payer: Self-pay | Admitting: Internal Medicine

## 2014-03-20 VITALS — BP 110/70 | HR 64 | Temp 97.7°F | Ht 74.0 in | Wt 146.0 lb

## 2014-03-20 DIAGNOSIS — J45909 Unspecified asthma, uncomplicated: Secondary | ICD-10-CM

## 2014-03-20 DIAGNOSIS — J309 Allergic rhinitis, unspecified: Secondary | ICD-10-CM

## 2014-03-20 MED ORDER — ALBUTEROL SULFATE HFA 108 (90 BASE) MCG/ACT IN AERS: 2.0000 | INHALATION_SPRAY | Freq: Four times a day (QID) | RESPIRATORY_TRACT | Status: DC | PRN

## 2014-03-20 MED ORDER — TRIAMCINOLONE ACETONIDE 0.1 % EX CREA: 1.0000 "application " | TOPICAL_CREAM | Freq: Two times a day (BID) | CUTANEOUS | Status: DC

## 2014-03-20 MED ORDER — TRIAMCINOLONE ACETONIDE 0.1 % EX CREA: 1.0000 "application " | TOPICAL_CREAM | Freq: Two times a day (BID) | CUTANEOUS | Status: AC

## 2014-03-20 NOTE — Progress Notes (Signed)
   Subjective:    Patient ID: [name], male    DOB: 04/07/1986, 28 y.o.   MRN: [id number]  HPI 28 year old patient who has a history of asthma and allergic rhinitis.  He is doing quite well.  He also has a history of atopic dermatitis.  No major concerns or complaints.  He requires a albuterol use once or twice per week, usually associated with the irritants, such as tobacco smoke exposure or dust related exposure at work.  History reviewed. No pertinent past medical history.  History   Social History  . Marital Status: Married    Spouse Name: N/A    Number of Children: N/A  . Years of Education: N/A   Occupational History  . Not on file.   Social History Main Topics  . Smoking status: Current Every Day Smoker    Last Attempt to Quit: 03/17/2011  . Smokeless tobacco: Never Used  . Alcohol Use: Yes  . Drug Use: No  . Sexual Activity: Not on file   Other Topics Concern  . Not on file   Social History Narrative  . No narrative on file    History reviewed. No pertinent past surgical history.  No family history on file.  No Known Allergies  No current outpatient prescriptions on file prior to visit.   No current facility-administered medications on file prior to visit.    BP 110/70  Pulse 64  Temp(Src) 97.7 F (36.5 C) (Oral)  Ht 6\' 2"  (1.88 m)  Wt 146 lb (66.225 kg)  BMI 18.74 kg/m2      Review of Systems  Constitutional: Negative for fever, chills, appetite change and fatigue.  HENT: Negative for congestion, dental problem, ear pain, hearing loss, sore throat, tinnitus, trouble swallowing and voice change.   Eyes: Negative for pain, discharge and visual disturbance.  Respiratory: Negative for cough, chest tightness, wheezing and stridor.   Cardiovascular: Negative for chest pain, palpitations and leg swelling.  Gastrointestinal: Negative for nausea, vomiting, abdominal pain, diarrhea, constipation, blood in stool and abdominal distention.    Genitourinary: Negative for urgency, hematuria, flank pain, discharge, difficulty urinating and genital sores.  Musculoskeletal: Negative for arthralgias, back pain, gait problem, joint swelling, myalgias and neck stiffness.  Skin: Negative for rash.  Neurological: Negative for dizziness, syncope, speech difficulty, weakness, numbness and headaches.  Hematological: Negative for adenopathy. Does not bruise/bleed easily.  Psychiatric/Behavioral: Negative for behavioral problems and dysphoric mood. The patient is not nervous/anxious.        Objective:   Physical Exam  Constitutional: He is oriented to person, place, and time. He appears well-developed.  HENT:  Head: Normocephalic.  Right Ear: External ear normal.  Left Ear: External ear normal.  Eyes: Conjunctivae and EOM are normal.  Neck: Normal range of motion.  Cardiovascular: Normal rate and normal heart sounds.   Pulmonary/Chest: Breath sounds normal.  Abdominal: Bowel sounds are normal.  Musculoskeletal: Normal range of motion. He exhibits no edema and no tenderness.  Neurological: He is alert and oriented to person, place, and time.  Skin: Rash noted.  Psychiatric: He has a normal mood and affect. His behavior is normal.          Assessment & Plan:   Asthma.  Mild and well-controlled Atopic dermatitis Allergic rhinitis  Medications updated Recheck one year or as needed

## 2014-03-20 NOTE — Progress Notes (Signed)
Pre visit review using our clinic review tool, if applicable. No additional management support is needed unless otherwise documented below in the visit note. 

## 2014-03-20 NOTE — Patient Instructions (Signed)
Eczema Eczema, also called atopic dermatitis, is a skin disorder that causes inflammation of the skin. It causes a red rash and dry, scaly skin. The skin becomes very itchy. Eczema is generally worse during the cooler winter months and often improves with the warmth of summer. Eczema usually starts showing signs in infancy. Some children outgrow eczema, but it may last through adulthood.  CAUSES  The exact cause of eczema is not known, but it appears to run in families. People with eczema often have a family history of eczema, allergies, asthma, or hay fever. Eczema is not contagious. Flare-ups of the condition may be caused by:   Contact with something you are sensitive or allergic to.   Stress. SIGNS AND SYMPTOMS  Dry, scaly skin.   Red, itchy rash.   Itchiness. This may occur before the skin rash and may be very intense.  DIAGNOSIS  The diagnosis of eczema is usually made based on symptoms and medical history. TREATMENT  Eczema cannot be cured, but symptoms usually can be controlled with treatment and other strategies. A treatment plan might include:  Controlling the itching and scratching.   Use over-the-counter antihistamines as directed for itching. This is especially useful at night when the itching tends to be worse.   Use over-the-counter steroid creams as directed for itching.   Avoid scratching. Scratching makes the rash and itching worse. It may also result in a skin infection (impetigo) due to a break in the skin caused by scratching.   Keeping the skin well moisturized with creams every day. This will seal in moisture and help prevent dryness. Lotions that contain alcohol and water should be avoided because they can dry the skin.   Limiting exposure to things that you are sensitive or allergic to (allergens).   Recognizing situations that cause stress.   Developing a plan to manage stress.  HOME CARE INSTRUCTIONS   Only take over-the-counter or  prescription medicines as directed by your health care provider.   Do not use anything on the skin without checking with your health care provider.   Keep baths or showers short (5 minutes) in warm (not hot) water. Use mild cleansers for bathing. These should be unscented. You may add nonperfumed bath oil to the bath water. It is best to avoid soap and bubble bath.   Immediately after a bath or shower, when the skin is still damp, apply a moisturizing ointment to the entire body. This ointment should be a petroleum ointment. This will seal in moisture and help prevent dryness. The thicker the ointment, the better. These should be unscented.   Keep fingernails cut short. Children with eczema may need to wear soft gloves or mittens at night after applying an ointment.   Dress in clothes made of cotton or cotton blends. Dress lightly, because heat increases itching.   A child with eczema should stay away from anyone with fever blisters or cold sores. The virus that causes fever blisters (herpes simplex) can cause a serious skin infection in children with eczema. SEEK MEDICAL CARE IF:   Your itching interferes with sleep.   Your rash gets worse or is not better within 1 week after starting treatment.   You see pus or soft yellow scabs in the rash area.   You have a fever.   You have a rash flare-up after contact with someone who has fever blisters.  Document Released: 08/11/2000 Document Revised: 06/04/2013 Document Reviewed: 03/17/2013 Exit[employer] Patient Information 2015 [employer]. This information   is not intended to replace advice given to you by your health care provider. Make sure you discuss any questions you have with your health care provider.  

## 2023-11-18 ENCOUNTER — Emergency Department (HOSPITAL_BASED_OUTPATIENT_CLINIC_OR_DEPARTMENT_OTHER)
Admission: EM | Admit: 2023-11-18 | Discharge: 2023-11-18 | Disposition: A | Discharge status: Home / Self Care | Payer: Self-pay | Attending: Emergency Medicine | Admitting: Emergency Medicine

## 2023-11-18 ENCOUNTER — Emergency Department (HOSPITAL_BASED_OUTPATIENT_CLINIC_OR_DEPARTMENT_OTHER): Payer: Self-pay

## 2023-11-18 ENCOUNTER — Encounter (HOSPITAL_BASED_OUTPATIENT_CLINIC_OR_DEPARTMENT_OTHER): Payer: Self-pay | Admitting: Emergency Medicine

## 2023-11-18 ENCOUNTER — Other Ambulatory Visit: Payer: Self-pay

## 2023-11-18 DIAGNOSIS — J45901 Unspecified asthma with (acute) exacerbation: Secondary | ICD-10-CM | POA: Insufficient documentation

## 2023-11-18 HISTORY — DX: Unspecified asthma, uncomplicated: J45.909

## 2023-11-18 MED ORDER — ALBUTEROL SULFATE HFA 108 (90 BASE) MCG/ACT IN AERS
2.0000 | INHALATION_SPRAY | RESPIRATORY_TRACT | Status: DC | PRN
  Administered 2023-11-18: 2 via RESPIRATORY_TRACT
  Filled 2023-11-18 (×2): qty 6.7

## 2023-11-18 MED ORDER — ALBUTEROL SULFATE HFA 108 (90 BASE) MCG/ACT IN AERS: 2.0000 | INHALATION_SPRAY | Freq: Four times a day (QID) | RESPIRATORY_TRACT | 6 refills | Status: AC | PRN

## 2023-11-18 MED ORDER — PREDNISONE 10 MG PO TABS
60.0000 mg | ORAL_TABLET | Freq: Once | ORAL | Status: AC
  Administered 2023-11-18: 60 mg via ORAL
  Filled 2023-11-18: qty 1

## 2023-11-18 MED ORDER — IPRATROPIUM-ALBUTEROL 0.5-2.5 (3) MG/3ML IN SOLN
3.0000 mL | Freq: Once | RESPIRATORY_TRACT | Status: AC
  Administered 2023-11-18: 3 mL via RESPIRATORY_TRACT
  Filled 2023-11-18: qty 3

## 2023-11-18 MED ORDER — PREDNISONE 10 MG PO TABS: 40.0000 mg | ORAL_TABLET | Freq: Every day | ORAL | 0 refills | Status: AC

## 2023-11-18 NOTE — Discharge Instructions (Addendum)
 Evaluation today was overall reassuring.  Suspect he may have had a mild asthma attack.  I am starting on a 5-day course of prednisone.  Your x-ray was negative for any acute findings.  I also sent refills of albuterol to your pharmacy.  Please follow-up your PCP.  If you develop shortness of breath, chest pain, cough with fever or any other concerning symptom please return emergency department further evaluation.

## 2023-11-18 NOTE — ED Triage Notes (Signed)
 Pt reports worsening asthma with increased need for rescue inhaler x 2 days. Lungs clear and pt states they feel tight.

## 2023-11-18 NOTE — ED Notes (Signed)
 RT in to listen to pt.

## 2023-11-18 NOTE — ED Provider Notes (Signed)
 [place] [facility] DEPARTMENT AT [facility] Provider Note   CSN: [id number] Arrival date & time: 11/18/23  1847     History  Chief Complaint  Patient presents with   Asthma   HPI [name] is a 38 y.o. male with a history of asthma presenting for asthma.  States in the last 2 weeks has had a nonproductive cough which seems to be worse at after this weekend.  He states he did "do a lot of partying" and noticed that his cough was worse and that he may have started to wheeze.  He uses inhaler but noticed that it was out of date.  He endorses generalized chest "tightness" but no pain states that at this moment he is not short of breath.  Denies cocaine use.   Asthma       Home Medications Prior to Admission medications   Medication Sig Start Date End Date Taking? Authorizing Provider  predniSONE (DELTASONE) 10 MG tablet Take 4 tablets (40 mg total) by mouth daily for 5 days. 11/18/23 11/23/23 Yes Gareth Eagle, PA-C  albuterol (VENTOLIN HFA) 108 (90 Base) MCG/ACT inhaler Inhale 2 puffs into the lungs every 6 (six) hours as needed. 11/18/23   Gareth Eagle, PA-C  triamcinolone cream (KENALOG) 0.1 % Apply 1 application topically 2 (two) times daily. 03/20/14   Gordy Savers, MD      Allergies    Patient has no known allergies.    Review of Systems   See HPI  Physical Exam Updated Vital Signs BP 114/79   Pulse 73   Temp 98.2 F (36.8 C)   Resp (!) 26 Comment: RT aware and starting tx  Ht 6\' 3"  (1.905 m)   Wt 81.6 kg   SpO2 100%   BMI 22.50 kg/m  Physical Exam Vitals and nursing note reviewed.  HENT:     Head: Normocephalic and atraumatic.     Mouth/Throat:     Mouth: Mucous membranes are moist.  Eyes:     General:        Right eye: No discharge.        Left eye: No discharge.     Conjunctiva/sclera: Conjunctivae normal.  Cardiovascular:     Rate and Rhythm: Normal rate and regular rhythm.     Pulses: Normal pulses.     Heart sounds:  Normal heart sounds.  Pulmonary:     Effort: Pulmonary effort is normal.     Breath sounds: Normal breath sounds.  Abdominal:     General: Abdomen is flat.     Palpations: Abdomen is soft.  Skin:    General: Skin is warm and dry.  Neurological:     General: No focal deficit present.  Psychiatric:        Mood and Affect: Mood normal.     ED Results / Procedures / Treatments   Labs (all labs ordered are listed, but only abnormal results are displayed) Labs Reviewed - No data to display  EKG None  Radiology DG Chest 2 View Result Date: 11/18/2023 CLINICAL DATA:  Cough and congestion for 2 weeks EXAM: CHEST - 2 VIEW COMPARISON:  04/03/2011 FINDINGS: The heart size and mediastinal contours are within normal limits. Both lungs are clear. The visualized skeletal structures are unremarkable. IMPRESSION: No active cardiopulmonary disease. Electronically Signed   By: Alcide Clever M.D.   On: 11/18/2023 21:41    Procedures Procedures    Medications Ordered in ED Medications  albuterol (VENTOLIN  HFA) 108 (90 Base) MCG/ACT inhaler 2 puff (2 puffs Inhalation Given 11/18/23 2048)  ipratropium-albuterol (DUONEB) 0.5-2.5 (3) MG/3ML nebulizer solution 3 mL (3 mLs Nebulization Given 11/18/23 1856)  predniSONE (DELTASONE) tablet 60 mg (60 mg Oral Given 11/18/23 2124)  ipratropium-albuterol (DUONEB) 0.5-2.5 (3) MG/3ML nebulizer solution 3 mL (3 mLs Nebulization Given 11/18/23 2114)    ED Course/ Medical Decision Making/ A&P                                 Medical Decision Making Risk Prescription drug management.   38 year old well-appearing male presenting for concern for asthma.  Exam is unremarkable.  DDx includes asthma exacerbation, pneumonia, pneumothorax, ACS, other.  I personally reviewed interpreted EKG which revealed sinus rhythm.  I personally reviewed interpreted x-ray which revealed no acute cardiopulmonary process.  Suspect he had a mild asthma exacerbation.  Treated with  DuoNebs x 2 and states he felt much better.  Also gave him a dose of prednisone.  Our respiratory therapist here was able to educate him on using a spacer and albuterol at home.  Sent a refill of his albuterol to his pharmacy along with a 5-day course of prednisone.  Advised to follow-up with his PCP.  Discussed return precautions.  Discharged in good condition.        Final Clinical Impression(s) / ED Diagnoses Final diagnoses:  Mild asthma with exacerbation, unspecified whether persistent    Rx / DC Orders ED Discharge Orders          Ordered    albuterol (VENTOLIN HFA) 108 (90 Base) MCG/ACT inhaler  Every 6 hours PRN        11/18/23 2147    predniSONE (DELTASONE) 10 MG tablet  Daily        11/18/23 2147              Gareth Eagle, PA-C 11/18/23 2151    Alvira Monday, MD 11/19/23 [phone]

## 2024-02-17 ENCOUNTER — Encounter (HOSPITAL_COMMUNITY): Payer: Self-pay

## 2024-02-17 ENCOUNTER — Emergency Department (HOSPITAL_COMMUNITY)
Admission: EM | Admit: 2024-02-17 | Discharge: 2024-02-17 | Disposition: A | Discharge status: Home / Self Care | Payer: Self-pay | Attending: Emergency Medicine | Admitting: Emergency Medicine

## 2024-02-17 ENCOUNTER — Other Ambulatory Visit: Payer: Self-pay

## 2024-02-17 DIAGNOSIS — F1090 Alcohol use, unspecified, uncomplicated: Secondary | ICD-10-CM | POA: Insufficient documentation

## 2024-02-17 DIAGNOSIS — T50901A Poisoning by unspecified drugs, medicaments and biological substances, accidental (unintentional), initial encounter: Secondary | ICD-10-CM

## 2024-02-17 DIAGNOSIS — T40411A Poisoning by fentanyl or fentanyl analogs, accidental (unintentional), initial encounter: Secondary | ICD-10-CM | POA: Insufficient documentation

## 2024-02-17 LAB — BASIC METABOLIC PANEL WITH GFR
Anion gap: 10 (ref 5–15)
BUN: 15 mg/dL (ref 6–20)
CO2: 24 mmol/L (ref 22–32)
Calcium: 9.1 mg/dL (ref 8.9–10.3)
Chloride: 102 mmol/L (ref 98–111)
Creatinine, Ser: 0.91 mg/dL (ref 0.61–1.24)
GFR, Estimated: >60 mL/min (ref >60)
Glucose, Bld: 95 mg/dL (ref 70–99)
Potassium: 4.5 mmol/L (ref 3.5–5.1)
Sodium: 136 mmol/L (ref 135–145)

## 2024-02-17 LAB — CBC WITH DIFFERENTIAL/PLATELET
Abs Immature Granulocytes: 0.06 10*3/uL (ref 0.00–0.07)
Basophils Absolute: 0 10*3/uL (ref 0.0–0.1)
Basophils Relative: 0 %
Eosinophils Absolute: 0 10*3/uL (ref 0.0–0.5)
Eosinophils Relative: 0 %
HCT: 40.9 % (ref 39.0–52.0)
Hemoglobin: 13.6 g/dL (ref 13.0–17.0)
Immature Granulocytes: 1 %
Lymphocytes Relative: 8 %
Lymphs Abs: 1 10*3/uL (ref 0.7–4.0)
MCH: 31.9 pg (ref 26.0–34.0)
MCHC: 33.3 g/dL (ref 30.0–36.0)
MCV: 96 fL (ref 80.0–100.0)
Monocytes Absolute: 0.8 10*3/uL (ref 0.1–1.0)
Monocytes Relative: 6 %
Neutro Abs: 11.3 10*3/uL — ABNORMAL HIGH (ref 1.7–7.7)
Neutrophils Relative %: 85 %
Platelets: 276 10*3/uL (ref 150–400)
RBC: 4.26 MIL/uL (ref 4.22–5.81)
RDW: 12.3 % (ref 11.5–15.5)
WBC: 13.3 10*3/uL — ABNORMAL HIGH (ref 4.0–10.5)
nRBC: 0 % (ref 0.0–0.2)

## 2024-02-17 MED ORDER — SODIUM CHLORIDE 0.9 % IV BOLUS
1000.0000 mL | Freq: Once | INTRAVENOUS | Status: AC
  Administered 2024-02-17: 1000 mL via INTRAVENOUS

## 2024-02-17 NOTE — ED Notes (Signed)
 Crackers, ginger ale, and lemon lime provided per pt request.

## 2024-02-17 NOTE — Discharge Instructions (Addendum)
 You have been seen and discharged from the emergency department.  You were seen after Narcan was given for fentanyl ingestion.  You were monitored and hydrated in the ER.  Your blood work was normal.  Follow-up with your primary provider for further evaluation and further care. Take home medications as prescribed. If you have any worsening symptoms or further concerns for your health please return to an emergency department for further evaluation.

## 2024-02-17 NOTE — ED Provider Notes (Signed)
 [place] EMERGENCY DEPARTMENT AT [facility] Provider Note   CSN: [id number] Arrival date & time: 02/17/24  1716     Patient presents with: Drug Overdose   [name] is a 38 y.o. male.   HPI   38 year old male presents emergency department after accidentally ingesting fentanyl.  He states that he snorted a substance that he then found out was fentanyl around 3 PM.  He called his friend over to monitor him who ended up administering Narcan around 415.  On arrival patient feels slightly sleepy and high but is protecting his airway and has no other acute complaints.  Has otherwise been in his usual state of health.  Is not a chronic fentanyl user.  Admits to some alcohol use but no other drug coingestions.  Prior to Admission medications   Medication Sig Start Date End Date Taking? Authorizing Provider  albuterol  (VENTOLIN  HFA) 108 (90 Base) MCG/ACT inhaler Inhale 2 puffs into the lungs every 6 (six) hours as needed. 11/18/23   Robinson, John K, PA-C  triamcinolone  cream (KENALOG ) 0.1 % Apply 1 application topically 2 (two) times daily. 03/20/14   Jame Maude FALCON, MD    Allergies: Patient has no known allergies.    Review of Systems  Constitutional:  Negative for fever.  Respiratory:  Negative for shortness of breath.   Cardiovascular:  Negative for chest pain.  Gastrointestinal:  Negative for abdominal pain, diarrhea and vomiting.  Skin:  Negative for rash.  Neurological:  Negative for seizures, syncope and headaches.    Updated Vital Signs BP 119/83   Pulse 69   Temp 97.8 F (36.6 C)   Resp 16   SpO2 100%   Physical Exam Vitals and nursing note reviewed.  Constitutional:      General: He is not in acute distress.    Appearance: Normal appearance.  HENT:     Head: Normocephalic.     Mouth/Throat:     Mouth: Mucous membranes are moist.   Eyes:     Extraocular Movements: Extraocular movements intact.     Pupils: Pupils are equal, round, and  reactive to light.    Cardiovascular:     Rate and Rhythm: Normal rate.  Pulmonary:     Effort: Pulmonary effort is normal. No respiratory distress.  Abdominal:     Palpations: Abdomen is soft.     Tenderness: There is no abdominal tenderness.   Skin:    General: Skin is warm.   Neurological:     Mental Status: He is alert and oriented to person, place, and time. Mental status is at baseline.   Psychiatric:        Mood and Affect: Mood normal.     (all labs ordered are listed, but only abnormal results are displayed) Labs Reviewed  CBC WITH DIFFERENTIAL/PLATELET  BASIC METABOLIC PANEL WITH GFR    EKG: None  Radiology: No results found.   Procedures   Medications Ordered in the ED  sodium chloride 0.9 % bolus 1,000 mL (1,000 mLs Intravenous New Bag/Given 02/17/24 1900)                                    Medical Decision Making Amount and/or Complexity of Data Reviewed Labs: ordered.   38 year old male presents emergency department after snorting a substance that he then found out was fentanyl.  Was monitored by his friend and ultimately given Narcan prior  to arrival.  He states that he still feels drowsy but improved.  Admits to coingestion with alcohol but no other drug use.  Vitals are normal and stable, he is protecting his airway.  Blood work is normal.  Patient was given additional hydration.  He was allowed to eat and drink and on reevaluation over 3 hours from Narcan administration he feels well and almost back to baseline.  Patient at this time appears safe and stable for discharge and close outpatient follow up. Discharge plan and strict return to ED precautions discussed, patient verbalizes understanding and agreement.     Final diagnoses:  None    ED Discharge Orders     None          Bari Roxie HERO, DO 02/17/24 2239

## 2024-02-17 NOTE — ED Triage Notes (Signed)
 Pt arrives via EMS after being dropped off at the the rescue squad. Pt reports he wanted to be seen because he snorted some fentanyl around 1500, someone that he knows administered Narcan at 1615. Pt arrives [facility].
